# Patient Record
Sex: Female | Born: 2005 | Race: Black or African American | Hispanic: No | Marital: Single | Smoking: Never smoker
Health system: Southern US, Community
[De-identification: ages and names within clinical notes are randomized; demographics above are authoritative.]

## PROBLEM LIST (undated history)

## (undated) ENCOUNTER — Ambulatory Visit (HOSPITAL_COMMUNITY)

---
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
## 2023-05-29 ENCOUNTER — Encounter: Payer: Self-pay | Admitting: Obstetrics and Gynecology

## 2023-11-03 IMAGING — DX DG FINGER INDEX 2+V*R*
2 series · 2 of 2 positions shown · non-contrast
Comparison: None.

CLINICAL DATA: Index finger pain, injury

EXAM:
RIGHT INDEX FINGER 2+V

[finger ap]
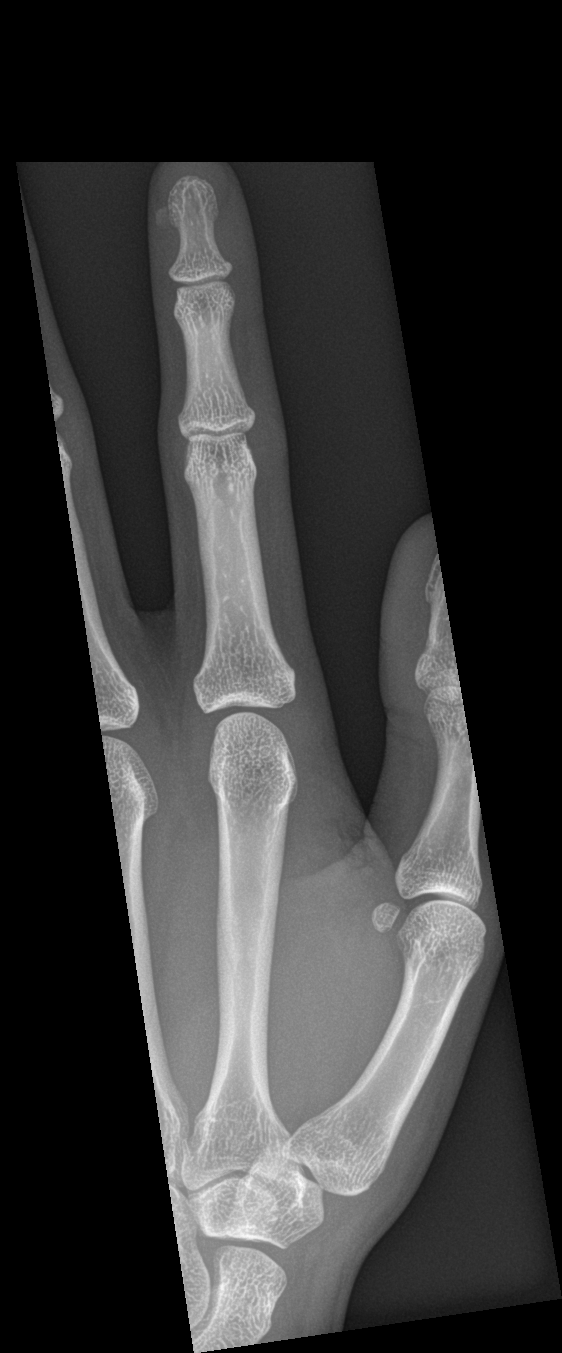

[finger lat]
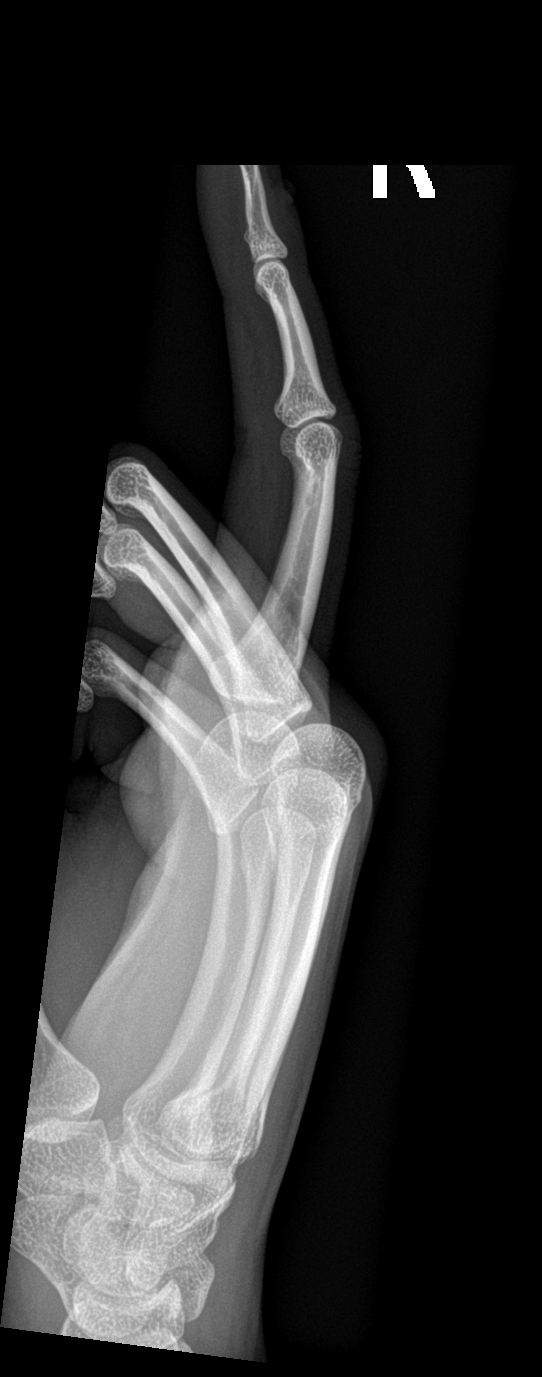

[2 of 2 positions shown; findings below may reference images not displayed]

FINDINGS: There is no evidence of fracture or dislocation. There is no
evidence of arthropathy or other focal bone abnormality. Soft
tissues are unremarkable.
IMPRESSION: Negative.

## 2024-05-13 ENCOUNTER — Ambulatory Visit: Admitting: Internal Medicine

## 2024-05-13 ENCOUNTER — Other Ambulatory Visit (HOSPITAL_COMMUNITY)
Admission: RE | Admit: 2024-05-13 | Discharge: 2024-05-13 | Disposition: A | Discharge status: Home / Self Care | Source: Ambulatory Visit | Attending: Internal Medicine | Admitting: Internal Medicine

## 2024-05-13 ENCOUNTER — Encounter: Payer: Self-pay | Admitting: Internal Medicine

## 2024-05-13 VITALS — BP 97/72 | HR 77 | Temp 98.2°F | Ht 59.0 in | Wt 101.6 lb

## 2024-05-13 DIAGNOSIS — Z113 Encounter for screening for infections with a predominantly sexual mode of transmission: Secondary | ICD-10-CM

## 2024-05-13 DIAGNOSIS — Z975 Presence of (intrauterine) contraceptive device: Secondary | ICD-10-CM | POA: Insufficient documentation

## 2024-05-13 DIAGNOSIS — T7819XA Other adverse food reactions, not elsewhere classified, initial encounter: Secondary | ICD-10-CM

## 2024-05-13 DIAGNOSIS — M7989 Other specified soft tissue disorders: Secondary | ICD-10-CM | POA: Diagnosis not present

## 2024-05-13 LAB — POCT URINE PREGNANCY: Preg Test, Ur: NEGATIVE

## 2024-05-13 NOTE — Progress Notes (Signed)
 " [facility] [address] [place] [address] Dept: [phone] Dept Fax: [phone]  New Patient Office Visit  Subjective:   Annette Burgess [birth year] 05/13/2024  Chief Complaint  Patient presents with   Establish Care    Lump on neck, supplement advice, std testing    HPI: Annette Burgess presents today to establish care at [facility]. Introduced to publishing rights manager role and practice setting.  All questions answered.  Concerns: See below   Discussed the use of AI scribe software for clinical note transcription with the patient, who gave verbal consent to proceed.  History of Present Illness   [name] is a 19 year old female who presents to establish care and for STD testing.  She is concerned about sexually transmitted disease (STD) testing due to a history of chlamydia. She has a thick vaginal discharge but denies burning or itching. She has not had any new sexual female partners and does not use protection. Her last menstrual period was irregular due to being on Nexplanon, and she has not had a period since last summer.  She is concerned about a lump on her neck, which she has noticed since late 2023. The lump does not cause pain or significant discomfort, but she occasionally feels it when swallowing or lying in certain positions. The patient does not think the lump has gotten bigger over time.  She seeks advice regarding history of oral allergy syndrome (OAS), as she cannot eat raw fruits and vegetables without symptoms. She consumes cooked vegetables and is considering nutritional supplements to compensate for potential nutrient deficiencies. She consumes dairy products for calcium intake and red meats for b12, iron.         The following portions of the patient's history were reviewed and updated as appropriate: past medical history, past surgical history, family history, social  history, allergies, medications, and problem list.   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 No pertinent past medical history. History reviewed. No pertinent surgical history. Family History  Problem Relation Age of Onset   Breast cancer Mother    Current Medications[1] Allergies[2]  ROS: A complete ROS was performed with pertinent positives/negatives noted in the HPI. The remainder of the ROS are negative.   Objective:   Today's Vitals   05/13/24 0909  BP: 97/72  Pulse: 77  Temp: 98.2 F (36.8 C)  TempSrc: Temporal  SpO2: 98%  Weight: 101 lb 9.6 oz (46.1 kg)  Height: 4' 11 (1.499 m)    GENERAL: Well-appearing, in NAD. Well nourished.  SKIN: Pink, warm and dry. Palpable , moveable, non-tender 1cm mass to left anterior neck above thyroid gland NECK: Trachea midline. Full ROM w/o pain or tenderness. No lymphadenopathy. No thyromegaly or palpable masses.  RESPIRATORY: Chest wall symmetrical. Respirations even and non-labored. Breath sounds clear to auscultation bilaterally.  CARDIAC: S1, S2 present, regular rate and rhythm. Peripheral pulses 2+ bilaterally.  EXTREMITIES: Without clubbing, cyanosis, or edema.  NEUROLOGIC: No motor or sensory deficits. Steady, even gait.  PSYCH/MENTAL STATUS: Alert, oriented x 3. Cooperative, appropriate mood and affect.   Health Maintenance Due  Topic Date Due   HIV Screening  Never done   HPV VACCINES (2 - 3-dose series) 11/09/2021   Hepatitis C Screening  Never done   Meningococcal B Vaccine (2  of 2 - Bexsero SCDM 2-dose series) 04/13/2022   Influenza Vaccine  Never done   COVID-19 Vaccine (1 - 2025-26 season) Never done    Results for orders placed or performed in visit on 05/13/24  POCT urine pregnancy  Result Value Ref Range   Preg Test, Ur Negative Negative    Assessment & Plan:   Assessment and Plan    Screening for sexually transmitted infections Concern for STDs with thick vaginal discharge. Previous chlamydia infection.  - Performed vaginal swab for gonorrhea, chlamydia, trichomoniasis, bacterial vaginosis, and yeast infection. - Obtained urine sample to rule out pregnancy. - Ordered blood work for HIV, hepatitis C, and syphilis.  Mass of soft tissue of neck Lump on neck present for years, occasionally noticeable when swallowing. Physical examination suggests a cyst, not a thyroid nodule. - Continue to monitor the cyst for changes in size or symptoms. - Consider ultrasound if the cyst becomes bothersome. - Refer to ENT if removal is desired in future.  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 Consumes cooked vegetables. Reports concern of lacking nutrients from raw fruits and vegetables. Some fatigue.  - Recommended starting a multivitamin, such as a women's once-a-day vitamin. - Advised heating vegetables to prevent allergic reactions. - Consider referral to an allergy specialist if fatigue persists.    Orders Placed This Encounter  Procedures   HIV antibody (with reflex)   Hepatitis C antibody   RPR W/RFLX TO RPR TITER, TREPONEMAL AB, SCREEN AND DIAGNOSIS   POCT urine pregnancy   No orders of the defined types were placed in this encounter.   Return in about 6 months (around 11/10/2024) for Annual Physical Exam with fasting lab work.   Rosina Senters, FNP     [1]  Current Outpatient Medications:    etonogestrel (NEXPLANON) 68 MG IMPL implant, 1 each by Subdermal route once., Disp: , Rfl:  [2] No Known Allergies  "

## 2024-05-14 ENCOUNTER — Ambulatory Visit: Payer: Self-pay | Admitting: Internal Medicine

## 2024-05-14 ENCOUNTER — Encounter: Payer: Self-pay | Admitting: Internal Medicine

## 2024-05-14 ENCOUNTER — Other Ambulatory Visit (HOSPITAL_COMMUNITY): Payer: Self-pay

## 2024-05-14 DIAGNOSIS — B9689 Other specified bacterial agents as the cause of diseases classified elsewhere: Secondary | ICD-10-CM

## 2024-05-14 LAB — CERVICOVAGINAL ANCILLARY ONLY
Bacterial Vaginitis (gardnerella): POSITIVE — AB
Candida Glabrata: NEGATIVE
Candida Vaginitis: NEGATIVE
Chlamydia: NEGATIVE
Comment: NEGATIVE
Comment: NEGATIVE
Comment: NEGATIVE
Comment: NEGATIVE
Comment: NEGATIVE
Comment: NORMAL
Neisseria Gonorrhea: NEGATIVE
Trichomonas: NEGATIVE

## 2024-05-14 LAB — SPECIMEN STATUS REPORT

## 2024-05-14 MED ORDER — METRONIDAZOLE 500 MG PO TABS
500.0000 mg | ORAL_TABLET | Freq: Two times a day (BID) | ORAL | 0 refills | Status: DC
  Filled 2024-05-14: qty 14, 7d supply, fill #0

## 2024-05-14 NOTE — Progress Notes (Signed)
 Hi Annette Burgess,  Your vaginal swab shows Bacterial vaginosis. This is NOT a sexually transmitted infection.  Sexual partners do NOT need to be treated.  Some women have a recurrence of the overgrowth even when fully treated.  Call the office if your symptoms begin again.  Do not douche, as this is associated with decreased cure rates and more bacterial overgrowths.  Take the full course of the antibiotic prescribed to you even if you begin to feel better. I have sent in the antibiotic to your pharmacy.   Do not drink alcohol with the antibiotic flagyl  (metronidazole ) as this drug will cause nausea and severe vomiting if you drink while taking antibiotic.    Annette Burgess

## 2024-05-18 ENCOUNTER — Other Ambulatory Visit (HOSPITAL_COMMUNITY): Payer: Self-pay

## 2024-05-18 MED ORDER — METRONIDAZOLE 0.75 % VA GEL
1.0000 | Freq: Every day | VAGINAL | 0 refills | Status: AC
  Filled 2024-05-18: qty 70, 5d supply, fill #0

## 2024-11-11 ENCOUNTER — Encounter: Admitting: Internal Medicine
